# Patient Record
Sex: Female | Born: 1960 | Race: Black or African American | Hispanic: No | Marital: Single | Smoking: Never smoker
Health system: Southern US, Community
[De-identification: ages and names within clinical notes are randomized; demographics above are authoritative.]

## PROBLEM LIST (undated history)

## (undated) HISTORY — PX: BRAIN SURGERY: SHX531

## (undated) HISTORY — PX: ABDOMINAL HYSTERECTOMY: SHX81

## (undated) HISTORY — PX: KNEE SURGERY: SHX244

## (undated) HISTORY — PX: TONSILLECTOMY: SUR1361

---

## 2017-02-24 ENCOUNTER — Encounter (HOSPITAL_BASED_OUTPATIENT_CLINIC_OR_DEPARTMENT_OTHER): Payer: Self-pay

## 2017-02-24 ENCOUNTER — Emergency Department (HOSPITAL_BASED_OUTPATIENT_CLINIC_OR_DEPARTMENT_OTHER): Payer: BLUE CROSS/BLUE SHIELD

## 2017-02-24 ENCOUNTER — Emergency Department (HOSPITAL_BASED_OUTPATIENT_CLINIC_OR_DEPARTMENT_OTHER)
Admission: EM | Admit: 2017-02-24 | Discharge: 2017-02-25 | Disposition: A | Discharge status: Home / Self Care | Payer: BLUE CROSS/BLUE SHIELD | Attending: Emergency Medicine | Admitting: Emergency Medicine

## 2017-02-24 DIAGNOSIS — W01198A Fall on same level from slipping, tripping and stumbling with subsequent striking against other object, initial encounter: Secondary | ICD-10-CM | POA: Diagnosis not present

## 2017-02-24 DIAGNOSIS — S060X0A Concussion without loss of consciousness, initial encounter: Secondary | ICD-10-CM | POA: Insufficient documentation

## 2017-02-24 DIAGNOSIS — Y998 Other external cause status: Secondary | ICD-10-CM | POA: Insufficient documentation

## 2017-02-24 DIAGNOSIS — S0990XA Unspecified injury of head, initial encounter: Secondary | ICD-10-CM | POA: Diagnosis present

## 2017-02-24 DIAGNOSIS — Y9289 Other specified places as the place of occurrence of the external cause: Secondary | ICD-10-CM | POA: Diagnosis not present

## 2017-02-24 DIAGNOSIS — Y9342 Activity, yoga: Secondary | ICD-10-CM | POA: Insufficient documentation

## 2017-02-24 MED ORDER — ACETAMINOPHEN 325 MG PO TABS
650.0000 mg | ORAL_TABLET | Freq: Once | ORAL | Status: AC
  Administered 2017-02-24: 650 mg via ORAL
  Filled 2017-02-24: qty 2

## 2017-02-24 NOTE — Discharge Instructions (Signed)
Do not participate in any sports or any activities that could result in head trauma until you are cleared by your pediatrician,  primary care physician or neurologist.   Take acetaminophen (Tylenol) up to 975 mg (this is normally 3 over-the-counter pills) up to 3 times a day. Do not drink alcohol. Make sure your other medications do not contain acetaminophen (Read the labels!)  Please follow with your primary care doctor in the next 2 days for a check-up. They must obtain records for further management.   Do not hesitate to return to the Emergency Department for any new, worsening or concerning symptoms.

## 2017-02-24 NOTE — ED Triage Notes (Signed)
Pt states she fell approx 6-7 feet from yoga suspension strap yesterday-struck back of head on terra cotta floor-pain to back of head, neck and upper back-NAD-steady gait

## 2017-02-24 NOTE — ED Provider Notes (Signed)
[facility] EMERGENCY DEPARTMENT Provider Note   CSN: [id number] Arrival date & time: 02/24/17  1913     History   Chief Complaint Chief Complaint  Patient presents with  . Fall     HPI   Blood pressure 139/82, pulse 72, temperature 97.8 F (36.6 C), temperature source Oral, resp. rate 18, height 5\' 2"  (1.575 m), weight 56.8 kg (125 lb 3.5 oz), SpO2 100 %.  Desiree Morrison is a 56 y.o. female complaining of 7 out of 10 basilar headache status post head trauma yesterday.  Patient was in a yoga class and she fell backwards from a standing position approximately 6 feet at the base of her skull hit a tile floor.  There was no loss of consciousness, she is not anticoagulated, there was no nausea, vomiting, change in vision, dysarthria, ataxia.  No pain medication taken prior to arrival.  She reports that she has a blushing sensation to the neck and bilateral cheeks.  She does not describe it as warmth, she states is worse on the right than the left.  She denies any decrease in sensation.  History reviewed. No pertinent past medical history.  There are no active problems to display for this patient.   Past Surgical History:  Procedure Laterality Date  . ABDOMINAL HYSTERECTOMY    . BRAIN SURGERY    . KNEE SURGERY    . TONSILLECTOMY      OB History    No data available       Home Medications    Prior to Admission medications   Not on File    Family History No family history on file.  Social History Social History  Substance Use Topics  . Smoking status: Never Smoker  . Smokeless tobacco: Never Used  . Alcohol use Yes     Comment: occ     Allergies   Compazine [prochlorperazine edisylate]; Other; and Tetracyclines & related   Review of Systems Review of Systems  A complete review of systems was obtained and all systems are negative except as noted in the HPI and PMH.    Physical Exam Updated Vital Signs BP 139/82 (BP Location: Left Arm)    Pulse 72   Temp 97.8 F (36.6 C) (Oral)   Resp 18   Ht 5\' 2"  (1.575 m)   Wt 56.8 kg (125 lb 3.5 oz)   SpO2 100%   BMI 22.90 kg/m   Physical Exam  Constitutional: She is oriented to person, place, and time. She appears well-developed and well-nourished.  HENT:  Head: Normocephalic and atraumatic.  Mouth/Throat: Oropharynx is clear and moist.  Eyes: Pupils are equal, round, and reactive to light. Conjunctivae and EOM are normal.  Neck: Normal range of motion. Neck supple.  FROM to C-spine. Pt can touch chin to chest without discomfort. No TTP of midline cervical spine.   Cardiovascular: Normal rate, regular rhythm and intact distal pulses.   Pulmonary/Chest: Effort normal and breath sounds normal. No respiratory distress. She has no wheezes. She has no rales. She exhibits no tenderness.  Abdominal: Soft. Bowel sounds are normal. There is no tenderness.  Musculoskeletal: Normal range of motion. She exhibits no edema or tenderness.  Neurological: She is alert and oriented to person, place, and time. No cranial nerve deficit.  II-Visual fields grossly intact. III/IV/VI-Extraocular movements intact.  Pupils reactive bilaterally. V/VII-Smile symmetric, equal eyebrow raise,  facial sensation intact VIII- Hearing grossly intact IX/X-Normal gag XI-bilateral shoulder shrug XII-midline tongue extension Motor: 5/5  bilaterally with normal tone and bulk Cerebellar: Normal finger-to-nose  and normal heel-to-shin test.   Romberg negative Ambulates with a coordinated gait   Nursing note and vitals reviewed.    ED Treatments / Results  Labs (all labs ordered are listed, but only abnormal results are displayed) Labs Reviewed - No data to display  EKG  EKG Interpretation None       Radiology Ct Head Wo Contrast  Result Date: 02/24/2017 CLINICAL DATA:  Fall from yoga strap yesterday. Posterior cervical neck pain and occipital pain. Head trauma, minor, GCS>=13, low clinical risk,  initial exam; C-spine trauma, low clinical risk (NEXUS/CCR) EXAM: CT HEAD WITHOUT CONTRAST CT CERVICAL SPINE WITHOUT CONTRAST TECHNIQUE: Multidetector CT imaging of the head and cervical spine was performed following the standard protocol without intravenous contrast. Multiplanar CT image reconstructions of the cervical spine were also generated. COMPARISON:  None. FINDINGS: CT HEAD FINDINGS Brain: No intracranial hemorrhage, mass effect, or midline shift. No hydrocephalus. The basilar cisterns are patent. No evidence of territorial infarct or acute ischemia. No extra-axial or intracranial fluid collection. Vascular: No hyperdense vessel or unexpected calcification. Skull: No fracture or focal lesion. Sinuses/Orbits: Paranasal sinuses and mastoid air cells are clear. The visualized orbits are unremarkable. Other: None. CT CERVICAL SPINE FINDINGS Alignment: Normal. Skull base and vertebrae: No acute fracture. Vertebral body heights are maintained. The dens and skull base are intact. Instance note of small bilateral cervical ribs. Soft tissues and spinal canal: No prevertebral fluid or swelling. No visible canal hematoma. Disc levels: Mild endplate spurring at C5-C6 with preservation of disc spaces. Upper chest: No acute abnormality. Other: None. IMPRESSION: 1.  No acute intracranial abnormality.  No skull fracture. 2. No fracture or subluxation of the cervical spine. Electronically Signed   By: Rubye Oaks M.D.   On: 02/24/2017 22:26   Ct Cervical Spine Wo Contrast  Result Date: 02/24/2017 CLINICAL DATA:  Fall from yoga strap yesterday. Posterior cervical neck pain and occipital pain. Head trauma, minor, GCS>=13, low clinical risk, initial exam; C-spine trauma, low clinical risk (NEXUS/CCR) EXAM: CT HEAD WITHOUT CONTRAST CT CERVICAL SPINE WITHOUT CONTRAST TECHNIQUE: Multidetector CT imaging of the head and cervical spine was performed following the standard protocol without intravenous contrast. Multiplanar  CT image reconstructions of the cervical spine were also generated. COMPARISON:  None. FINDINGS: CT HEAD FINDINGS Brain: No intracranial hemorrhage, mass effect, or midline shift. No hydrocephalus. The basilar cisterns are patent. No evidence of territorial infarct or acute ischemia. No extra-axial or intracranial fluid collection. Vascular: No hyperdense vessel or unexpected calcification. Skull: No fracture or focal lesion. Sinuses/Orbits: Paranasal sinuses and mastoid air cells are clear. The visualized orbits are unremarkable. Other: None. CT CERVICAL SPINE FINDINGS Alignment: Normal. Skull base and vertebrae: No acute fracture. Vertebral body heights are maintained. The dens and skull base are intact. Instance note of small bilateral cervical ribs. Soft tissues and spinal canal: No prevertebral fluid or swelling. No visible canal hematoma. Disc levels: Mild endplate spurring at C5-C6 with preservation of disc spaces. Upper chest: No acute abnormality. Other: None. IMPRESSION: 1.  No acute intracranial abnormality.  No skull fracture. 2. No fracture or subluxation of the cervical spine. Electronically Signed   By: Rubye Oaks M.D.   On: 02/24/2017 22:26    Procedures Procedures (including critical care time)  Medications Ordered in ED Medications  acetaminophen (TYLENOL) tablet 650 mg (650 mg Oral Given 02/24/17 2121)     Initial Impression / Assessment and Plan / ED  Course  I have reviewed the triage vital signs and the nursing notes.  Pertinent labs & imaging results that were available during my care of the patient were reviewed by me and considered in my medical decision making (see chart for details).     Vitals:   02/24/17 1920  BP: 139/82  Pulse: 72  Resp: 18  Temp: 97.8 F (36.6 C)  TempSrc: Oral  SpO2: 100%  Weight: 56.8 kg (125 lb 3.5 oz)  Height: 5\' 2"  (1.575 m)    Medications  acetaminophen (TYLENOL) tablet 650 mg (650 mg Oral Given 02/24/17 2121)    SUSSAN METER is 56 y.o. female presenting with Headache after falling approximately 6 feet yesterday onto a tile floor.  There was no loss of consciousness, this patient is not anticoagulated.  Nonfocal neurologic exam except for a paresthesia to the bilateral cheeks and neck.  Head and C-spine CT negative, patient given concussion precautions and advised to follow closely with primary care.  Evaluation does not show pathology that would require ongoing emergent intervention or inpatient treatment. Pt is hemodynamically stable and mentating appropriately. Discussed findings and plan with patient/guardian, who agrees with care plan. All questions answered. Return precautions discussed and outpatient follow up given.      Final Clinical Impressions(s) / ED Diagnoses   Final diagnoses:  Concussion without loss of consciousness, initial encounter    New Prescriptions New Prescriptions   No medications on file     Kaylyn Lim 02/25/17 0109    Vanetta Mulders, MD 03/01/17 [phone]

## 2020-02-27 ENCOUNTER — Emergency Department (HOSPITAL_BASED_OUTPATIENT_CLINIC_OR_DEPARTMENT_OTHER)
Admission: EM | Admit: 2020-02-27 | Discharge: 2020-02-27 | Disposition: A | Discharge status: Home / Self Care | Payer: No Typology Code available for payment source | Attending: Emergency Medicine | Admitting: Emergency Medicine

## 2020-02-27 ENCOUNTER — Encounter (HOSPITAL_BASED_OUTPATIENT_CLINIC_OR_DEPARTMENT_OTHER): Payer: Self-pay | Admitting: Emergency Medicine

## 2020-02-27 ENCOUNTER — Other Ambulatory Visit: Payer: Self-pay

## 2020-02-27 ENCOUNTER — Emergency Department (HOSPITAL_BASED_OUTPATIENT_CLINIC_OR_DEPARTMENT_OTHER): Payer: No Typology Code available for payment source

## 2020-02-27 DIAGNOSIS — R519 Headache, unspecified: Secondary | ICD-10-CM

## 2020-02-27 DIAGNOSIS — R5383 Other fatigue: Secondary | ICD-10-CM | POA: Insufficient documentation

## 2020-02-27 LAB — CBC
HCT: 41.6 % (ref 36.0–46.0)
Hemoglobin: 13.1 g/dL (ref 12.0–15.0)
MCH: 27.7 pg (ref 26.0–34.0)
MCHC: 31.5 g/dL (ref 30.0–36.0)
MCV: 87.9 fL (ref 80.0–100.0)
Platelets: 273 10*3/uL (ref 150–400)
RBC: 4.73 MIL/uL (ref 3.87–5.11)
RDW: 13.6 % (ref 11.5–15.5)
WBC: 10.1 10*3/uL (ref 4.0–10.5)
nRBC: 0 % (ref 0.0–0.2)

## 2020-02-27 LAB — BASIC METABOLIC PANEL
Anion gap: 10 (ref 5–15)
BUN: 14 mg/dL (ref 6–20)
CO2: 24 mmol/L (ref 22–32)
Calcium: 9.6 mg/dL (ref 8.9–10.3)
Chloride: 105 mmol/L (ref 98–111)
Creatinine, Ser: 0.75 mg/dL (ref 0.44–1.00)
GFR, Estimated: >60 mL/min (ref >60)
Glucose, Bld: 105 mg/dL — ABNORMAL HIGH (ref 70–99)
Potassium: 3.9 mmol/L (ref 3.5–5.1)
Sodium: 139 mmol/L (ref 135–145)

## 2020-02-27 MED ORDER — SODIUM CHLORIDE 0.9 % IV BOLUS
1000.0000 mL | Freq: Once | INTRAVENOUS | Status: AC
  Administered 2020-02-27: 1000 mL via INTRAVENOUS

## 2020-02-27 MED ORDER — DEXAMETHASONE SODIUM PHOSPHATE 10 MG/ML IJ SOLN
10.0000 mg | Freq: Once | INTRAMUSCULAR | Status: AC
  Administered 2020-02-27: 10 mg via INTRAVENOUS
  Filled 2020-02-27: qty 1

## 2020-02-27 MED ORDER — KETOROLAC TROMETHAMINE 15 MG/ML IJ SOLN
15.0000 mg | Freq: Once | INTRAMUSCULAR | Status: AC
  Administered 2020-02-27: 15 mg via INTRAVENOUS
  Filled 2020-02-27: qty 1

## 2020-02-27 NOTE — ED Notes (Signed)
In to evaluate pt and begin IVF as per ED MD orders, pt on phone texting, standing at bedside, appears in no distress. Will return when pt is ready

## 2020-02-27 NOTE — ED Notes (Signed)
AVS reviewed with client. Copy of AVS provided, Opportunity for questions provided

## 2020-02-27 NOTE — ED Triage Notes (Addendum)
Headache since Friday with high BP readings at home. States that on Friday she had a 20 sec period of total vision loss. Vision has been normal since then. Pt reports she does not feel like herself.

## 2020-02-27 NOTE — ED Provider Notes (Signed)
[facility] EMERGENCY DEPARTMENT Provider Note   CSN: [id number] Arrival date & time: 02/27/20  1425     History Chief Complaint  Patient presents with  . Headache  . Fatigue    Desiree Morrison is a 59 y.o. female.  The history is provided by the patient.  Headache Pain location:  Generalized Quality:  Sharp Radiates to:  Does not radiate Timing:  Constant Progression:  Improving Chronicity:  New Relieved by:  Acetaminophen Worsened by:  Nothing Associated symptoms: no abdominal pain, no back pain, no cough, no dizziness, no ear pain, no eye pain, no fever, no numbness, no seizures, no sore throat, no vomiting and no weakness        History reviewed. No pertinent past medical history.  There are no problems to display for this patient.   Past Surgical History:  Procedure Laterality Date  . ABDOMINAL HYSTERECTOMY    . BRAIN SURGERY    . KNEE SURGERY    . TONSILLECTOMY       OB History   No obstetric history on file.     No family history on file.  Social History   Tobacco Use  . Smoking status: Never Smoker  . Smokeless tobacco: Never Used  Substance Use Topics  . Alcohol use: Yes    Comment: occ  . Drug use: No    Home Medications Prior to Admission medications   Not on File    Allergies    Compazine [prochlorperazine edisylate], Other, and Tetracyclines & related  Review of Systems   Review of Systems  Constitutional: Negative for chills and fever.  HENT: Negative for ear pain and sore throat.   Eyes: Positive for visual disturbance. Negative for pain.  Respiratory: Negative for cough and shortness of breath.   Cardiovascular: Negative for chest pain and palpitations.  Gastrointestinal: Negative for abdominal pain and vomiting.  Genitourinary: Negative for dysuria and hematuria.  Musculoskeletal: Negative for arthralgias and back pain.  Skin: Negative for color change and rash.  Neurological: Positive for headaches. Negative  for dizziness, tremors, seizures, syncope, facial asymmetry, speech difficulty, weakness, light-headedness and numbness.  All other systems reviewed and are negative.   Physical Exam Updated Vital Signs  ED Triage Vitals  Enc Vitals Group     BP 02/27/20 1431 (!) 142/81     Pulse Rate 02/27/20 1431 71     Resp 02/27/20 1431 18     Temp 02/27/20 1431 98.7 F (37.1 C)     Temp Source 02/27/20 1431 Oral     SpO2 02/27/20 1431 100 %     Weight 02/27/20 1429 127 lb (57.6 kg)     Height 02/27/20 1429 5\' 2"  (1.575 m)     Head Circumference --      Peak Flow --      Pain Score 02/27/20 1430 4     Pain Loc --      Pain Edu? --      Excl. in GC? --      Physical Exam Vitals and nursing note reviewed.  Constitutional:      General: She is not in acute distress.    Appearance: She is well-developed. She is not ill-appearing.  HENT:     Head: Normocephalic and atraumatic.     Mouth/Throat:     Mouth: Mucous membranes are moist.  Eyes:     General: No visual field deficit.    Extraocular Movements: Extraocular movements intact.  Conjunctiva/sclera: Conjunctivae normal.     Pupils: Pupils are equal, round, and reactive to light.  Cardiovascular:     Rate and Rhythm: Normal rate and regular rhythm.     Heart sounds: Normal heart sounds. No murmur heard.   Pulmonary:     Effort: Pulmonary effort is normal. No respiratory distress.     Breath sounds: Normal breath sounds.  Abdominal:     Palpations: Abdomen is soft.     Tenderness: There is no abdominal tenderness.  Musculoskeletal:        General: Normal range of motion.     Cervical back: Normal range of motion and neck supple.  Skin:    General: Skin is warm and dry.  Neurological:     Mental Status: She is alert and oriented to person, place, and time.     Cranial Nerves: No cranial nerve deficit, dysarthria or facial asymmetry.     Sensory: No sensory deficit.     Motor: No weakness.     Coordination: Coordination  normal.     Comments: No visual field deficit, 5+ out of 5 strength throughout, normal sensation, no drift, normal finger-nose-finger, normal speech  Psychiatric:        Mood and Affect: Mood normal.     ED Results / Procedures / Treatments   Labs (all labs ordered are listed, but only abnormal results are displayed) Labs Reviewed  BASIC METABOLIC PANEL - Abnormal; Notable for the following components:      Result Value   Glucose, Bld 105 (*)    All other components within normal limits  CBC    EKG EKG Interpretation  Date/Time:  Sunday February 27 2020 14:30:06 EDT Ventricular Rate:  64 PR Interval:  134 QRS Duration: 72 QT Interval:  358 QTC Calculation: 369 R Axis:   44 Text Interpretation: Normal sinus rhythm Normal ECG Confirmed by Virgina Norfolk [phone]) on 02/27/2020 4:16:21 PM   Radiology CT Head Wo Contrast  Result Date: 02/27/2020 CLINICAL DATA:  Headache EXAM: CT HEAD WITHOUT CONTRAST TECHNIQUE: Contiguous axial images were obtained from the base of the skull through the vertex without intravenous contrast. COMPARISON:  02/24/2017 FINDINGS: Brain: No acute intracranial abnormality. Specifically, no hemorrhage, hydrocephalus, mass lesion, acute infarction, or significant intracranial injury. Vascular: No hyperdense vessel or unexpected calcification. Skull: No acute calvarial abnormality. Sinuses/Orbits: Visualized paranasal sinuses and mastoids clear. Orbital soft tissues unremarkable. Other: None IMPRESSION: Normal study. Electronically Signed   By: Charlett Nose M.D.   On: 02/27/2020 16:38    Procedures Procedures (including critical care time)  Medications Ordered in ED Medications  sodium chloride 0.9 % bolus 1,000 mL (1,000 mLs Intravenous New Bag/Given 02/27/20 1651)  ketorolac (TORADOL) 15 MG/ML injection 15 mg (15 mg Intravenous Given 02/27/20 1734)  dexamethasone (DECADRON) injection 10 mg (10 mg Intravenous Given 02/27/20 1734)    ED Course  I have  reviewed the triage vital signs and the nursing notes.  Pertinent labs & imaging results that were available during my care of the patient were reviewed by me and considered in my medical decision making (see chart for details).    MDM Rules/Calculators/A&P                          Desiree Morrison is a 59 year old female with no significant medical history presents the ED with headache.  Normal vitals.  No fever.  Mild headache for the last several days.  Headache started  after long car drive.  She had some visual auras and then a headache but has gradually gotten better over the last several days.  Energy levels have been low.  Neurologically she is intact.  No concern for stroke.  No significant anemia, electrolyte abnormality.  EKG shows sinus rhythm.  Doubt cardiac process.  Overall suspect tension type headache with visual auras.  We will have her follow-up with primary care doctor and discharged from the ED in good condition.  This chart was dictated using voice recognition software.  Despite best efforts to proofread,  errors can occur which can change the documentation meaning.    Final Clinical Impression(s) / ED Diagnoses Final diagnoses:  Other fatigue  Bad headache    Rx / DC Orders ED Discharge Orders    None       Virgina Norfolk, DO 02/27/20 1738

## 2020-02-27 NOTE — ED Notes (Signed)
IVF bolus initiated, instructed pt to turn off mobile phone and computer, lights were dimmed to promote rest and in decreasing  HA syptoms

## 2020-03-08 ENCOUNTER — Other Ambulatory Visit: Payer: Self-pay

## 2020-03-08 ENCOUNTER — Emergency Department (HOSPITAL_BASED_OUTPATIENT_CLINIC_OR_DEPARTMENT_OTHER)
Admission: EM | Admit: 2020-03-08 | Discharge: 2020-03-08 | Disposition: A | Discharge status: Home / Self Care | Payer: No Typology Code available for payment source | Attending: Emergency Medicine | Admitting: Emergency Medicine

## 2020-03-08 ENCOUNTER — Encounter (HOSPITAL_BASED_OUTPATIENT_CLINIC_OR_DEPARTMENT_OTHER): Payer: Self-pay | Admitting: *Deleted

## 2020-03-08 DIAGNOSIS — R519 Headache, unspecified: Secondary | ICD-10-CM

## 2020-03-08 LAB — SEDIMENTATION RATE: Sed Rate: 17 mm/h (ref 0–22)

## 2020-03-08 MED ORDER — LACTATED RINGERS IV BOLUS
1000.0000 mL | Freq: Once | INTRAVENOUS | Status: AC
  Administered 2020-03-08: 1000 mL via INTRAVENOUS

## 2020-03-08 MED ORDER — DIPHENHYDRAMINE HCL 50 MG/ML IJ SOLN
25.0000 mg | Freq: Once | INTRAMUSCULAR | Status: AC
  Administered 2020-03-08: 25 mg via INTRAVENOUS
  Filled 2020-03-08: qty 1

## 2020-03-08 MED ORDER — METOCLOPRAMIDE HCL 5 MG/ML IJ SOLN
10.0000 mg | INTRAMUSCULAR | Status: AC
  Administered 2020-03-08: 10 mg via INTRAVENOUS
  Filled 2020-03-08: qty 2

## 2020-03-08 MED ORDER — SODIUM CHLORIDE 0.9 % IV SOLN
INTRAVENOUS | Status: DC | PRN
  Administered 2020-03-08: 500 mL via INTRAVENOUS

## 2020-03-08 MED ORDER — MAGNESIUM SULFATE 2 GM/50ML IV SOLN
2.0000 g | Freq: Once | INTRAVENOUS | Status: AC
  Administered 2020-03-08: 2 g via INTRAVENOUS
  Filled 2020-03-08: qty 50

## 2020-03-08 MED ORDER — KETOROLAC TROMETHAMINE 15 MG/ML IJ SOLN
15.0000 mg | Freq: Once | INTRAMUSCULAR | Status: AC
  Administered 2020-03-08: 15 mg via INTRAVENOUS
  Filled 2020-03-08: qty 1

## 2020-03-08 NOTE — ED Triage Notes (Addendum)
Was here recently with the same complaint of headache. Has an appointment for an MRI on the 15th by her PCP.  The headache did not really resolved since she left here.  It is getting worst.

## 2020-03-08 NOTE — ED Provider Notes (Signed)
[place] EMERGENCY DEPARTMENT Provider Note   CSN: [id number] Arrival date & time: 03/08/20  1015     History Chief Complaint  Patient presents with  . Headache    Desiree Morrison is a 59 y.o. female.  59 year old female who presents with headache.  Patient presented here on 10/31 for headache.  She states that about a week prior to presentation, she had gradual onset of mild headache that eventually became persistent and more severe.  She has had 1 single brief episode of bilateral vision loss that had lasted approximately 20 seconds then resolved, never reoccurred.  When she was evaluated in the ED, she had normal head CT and reassuring lab work.  She was given medications and discharged home.  She states that her headache was only slightly improved at time of discharge.  Since then, she has continued to have headaches at home that she describes as frontal behind her left eye.  She has been taking Tylenol at home without much relief.  She recently tried Maxalt from a friend but no improvement.  She has had associated photophobia and feels like she is not mentally processing things as quickly as she normally does but she denies any confusion, speech problems, extremity weakness, or problems walking.  No fevers or vision changes since that brief initial episode.  She has followed up with the [place] [facility] and they referred her to neurology and have scheduled an MRI for 10/15 but headache was severe today which prompted her to come in. She takes estrogen patch for night sweats, no h/o blood clots. No hx migraines.  The history is provided by the patient.  Headache      History reviewed. No pertinent past medical history.  There are no problems to display for this patient.   Past Surgical History:  Procedure Laterality Date  . ABDOMINAL HYSTERECTOMY    . BRAIN SURGERY    . KNEE SURGERY    . TONSILLECTOMY       OB History   No obstetric history on file.     History  reviewed. No pertinent family history.  Social History   Tobacco Use  . Smoking status: Never Smoker  . Smokeless tobacco: Never Used  Substance Use Topics  . Alcohol use: Yes    Comment: occ  . Drug use: No    Home Medications Prior to Admission medications   Not on File    Allergies    Compazine [prochlorperazine edisylate], Other, and Tetracyclines & related  Review of Systems   Review of Systems  Neurological: Positive for headaches.   All other systems reviewed and are negative except that which was mentioned in HPI  Physical Exam Updated Vital Signs BP (!) 114/95 (BP Location: Right Arm)   Pulse 71   Temp 98.1 F (36.7 C) (Oral)   Resp 19   Ht 5' 2"  (1.575 m)   Wt 57.2 kg   SpO2 100%   BMI 23.05 kg/m   Physical Exam Vitals and nursing note reviewed.  Constitutional:      General: She is not in acute distress.    Appearance: She is well-developed.     Comments: Awake but towel over head, in dark room  HENT:     Head: Normocephalic and atraumatic.  Eyes:     Extraocular Movements: Extraocular movements intact.     Conjunctiva/sclera: Conjunctivae normal.     Pupils: Pupils are equal, round, and reactive to light.  Cardiovascular:  Rate and Rhythm: Normal rate and regular rhythm.     Heart sounds: Normal heart sounds. No murmur heard.   Pulmonary:     Effort: Pulmonary effort is normal. No respiratory distress.     Breath sounds: Normal breath sounds.  Abdominal:     General: Bowel sounds are normal. There is no distension.     Palpations: Abdomen is soft.     Tenderness: There is no abdominal tenderness.  Musculoskeletal:     Cervical back: Neck supple.  Skin:    General: Skin is warm and dry.  Neurological:     Mental Status: She is alert and oriented to person, place, and time.     Cranial Nerves: No cranial nerve deficit.     Motor: No abnormal muscle tone.     Deep Tendon Reflexes: Reflexes are normal and symmetric.     Comments:  Fluent speech, normal finger-to-nose testing, negative pronator drift, no clonus 5/5 strength and normal sensation x all 4 extremities  Psychiatric:        Speech: Speech normal.        Thought Content: Thought content normal.        Judgment: Judgment normal.     ED Results / Procedures / Treatments   Labs (all labs ordered are listed, but only abnormal results are displayed) Labs Reviewed  SEDIMENTATION RATE    EKG None  Radiology No results found.  Procedures Procedures (including critical care time)  Medications Ordered in ED Medications  0.9 %  sodium chloride infusion (0 mLs Intravenous Stopped 03/08/20 1322)  lactated ringers bolus 1,000 mL (0 mLs Intravenous Stopped 03/08/20 1321)  magnesium sulfate IVPB 2 g 50 mL (0 g Intravenous Stopped 03/08/20 1322)  diphenhydrAMINE (BENADRYL) injection 25 mg (25 mg Intravenous Given 03/08/20 1158)  metoCLOPramide (REGLAN) injection 10 mg (10 mg Intravenous Given 03/08/20 1158)  ketorolac (TORADOL) 15 MG/ML injection 15 mg (15 mg Intravenous Given 03/08/20 1158)    ED Course  I have reviewed the triage vital signs and the nursing notes.  Pertinent labs that were available during my care of the patient were reviewed by me and considered in my medical decision making (see chart for details).    MDM Rules/Calculators/A&P                          Neurologically iintact on exam, normal VS. Recent normal head CT makes SDH or mass unlikely. Gave migraine cocktail and obtained ESR to evaluate for GCA. ESR normal. After above medications, pt stated she felt much better w/ significant improvement in headache. She wants to go home. [facility] in 5  Days. I considered the possibility of venous sinus thrombosis but with significant improvement in pain, I feel it is reasonable to hold off on ED transfer for MRI today. I have reviewed return precautions and discussed supportive measures. Final  Clinical Impression(s) / ED Diagnoses Final diagnoses:  Bad headache    Rx / DC Orders ED Discharge Orders    None       Audrianna Driskill, Wenda Overland, MD 03/08/20 1400

## 2020-03-08 NOTE — ED Notes (Signed)
Review D/C papers with pt, pt states understanding, pt denies questions at this time. 

## 2022-02-27 IMAGING — CT CT HEAD W/O CM
3 series · 16 of 47 positions shown, 19 images · non-contrast
Comparison: 02/24/2017

CLINICAL DATA: Headache

EXAM:
CT HEAD WITHOUT CONTRAST
TECHNIQUE: Contiguous axial images were obtained from the base of the skull
through the vertex without intravenous contrast.

[Series 2: head wo · axial · 0.44mm/px · z∈[-172,-47]mm · 10 of 31 slices shown, 13 images]
[im 3/31  brain]
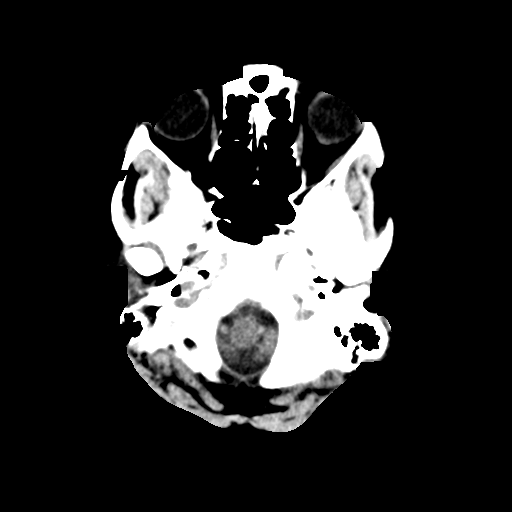
[im 3/31  bone]
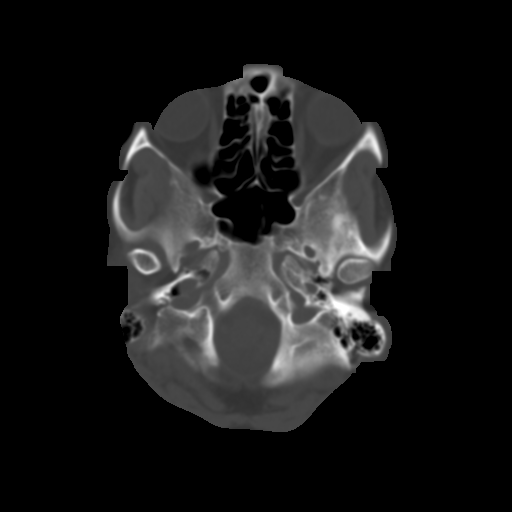
[im 6/31  brain]
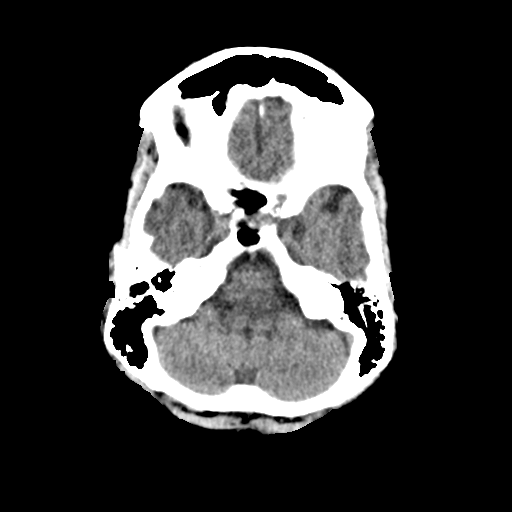
[im 9/31  brain]
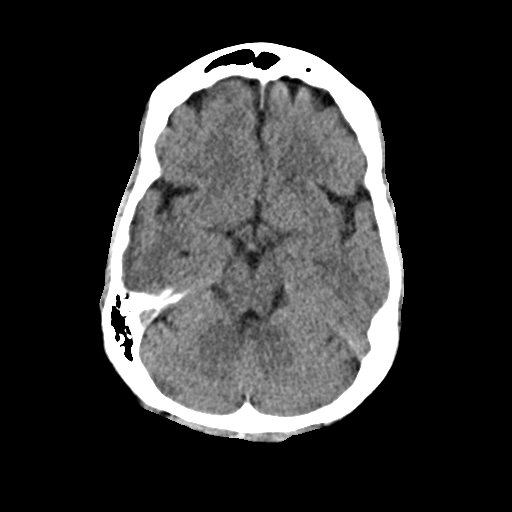
[im 11/31  brain]
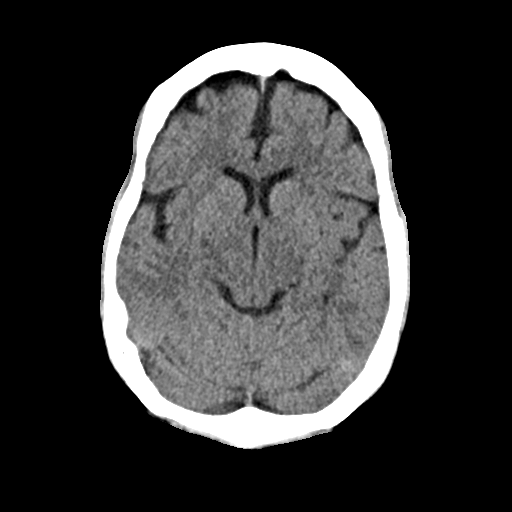
[im 14/31  brain]
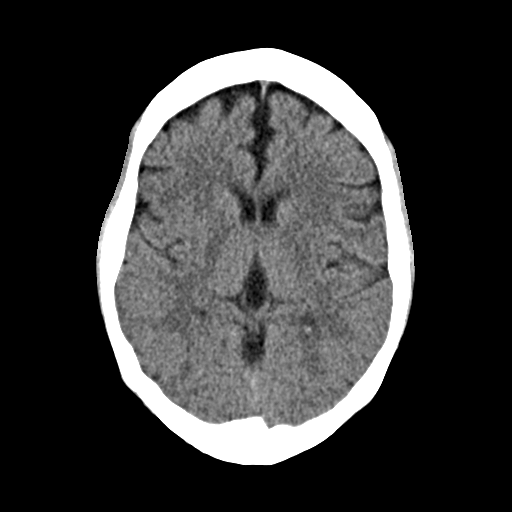
[im 14/31  bone]
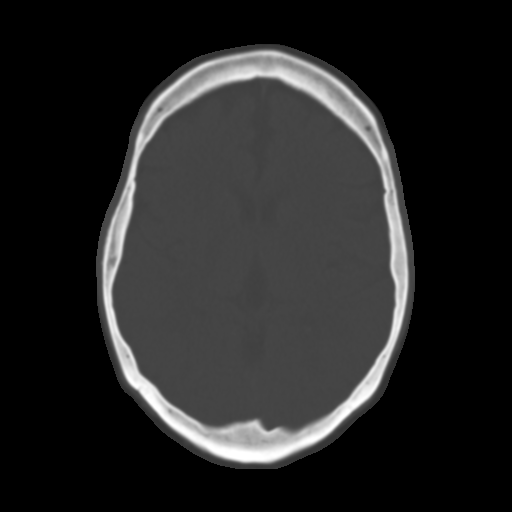
[im 17/31  brain]
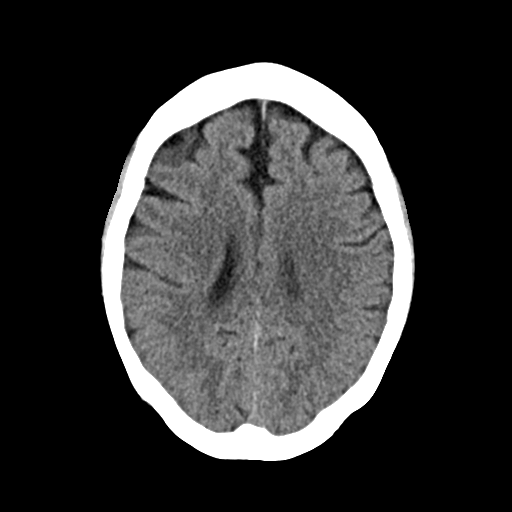
[im 20/31  brain]
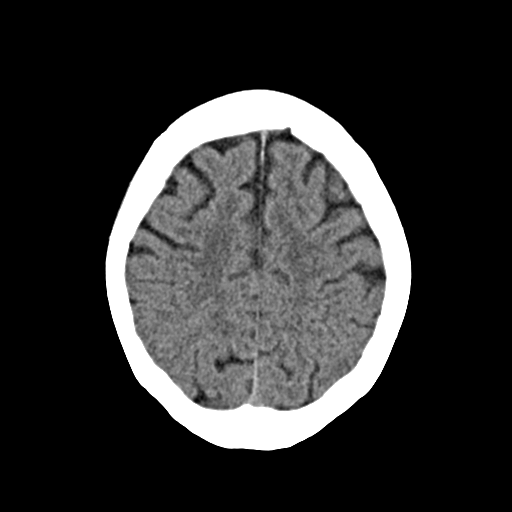
[im 23/31  brain]
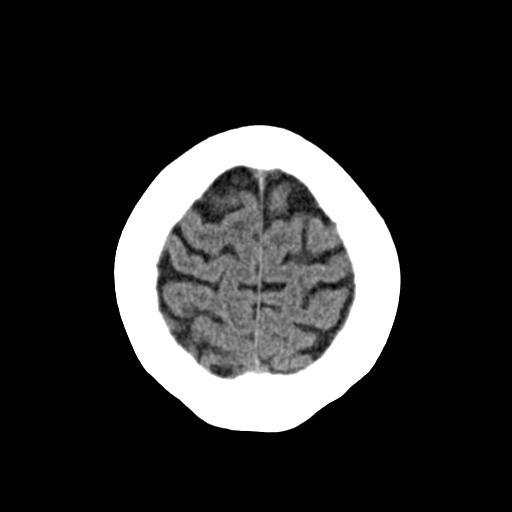
[im 25/31  brain]
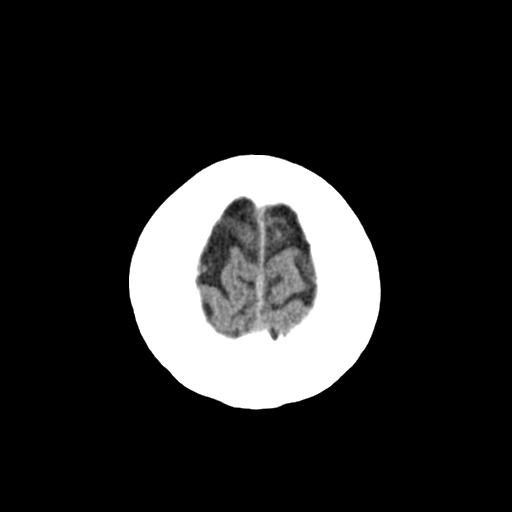
[im 25/31  bone]
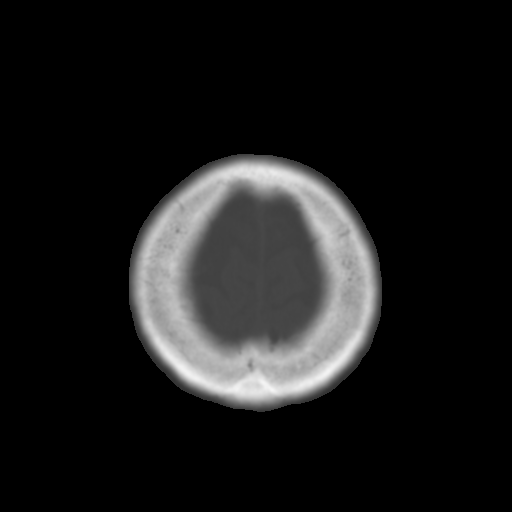
[im 28/31  brain]
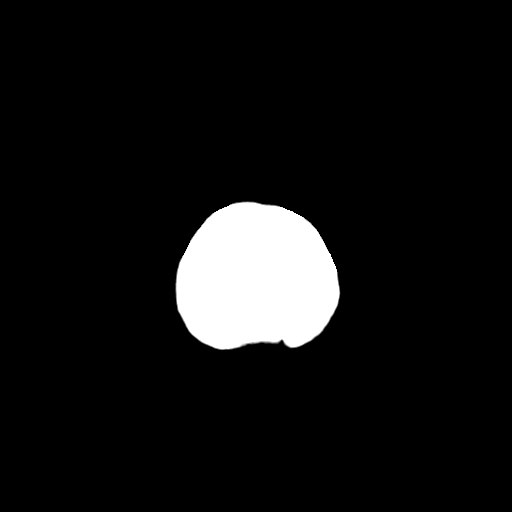

[Series 4: coronal soft · coronal · 0.31mm/px · 3 of 67 slices shown]
[im 23/67  brain]
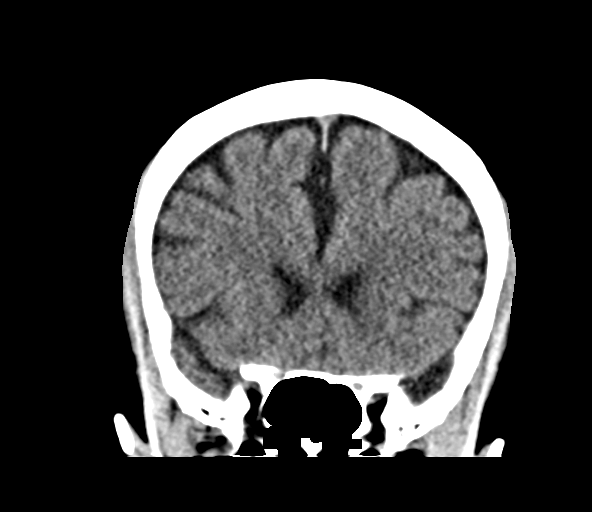
[im 30/67  brain]
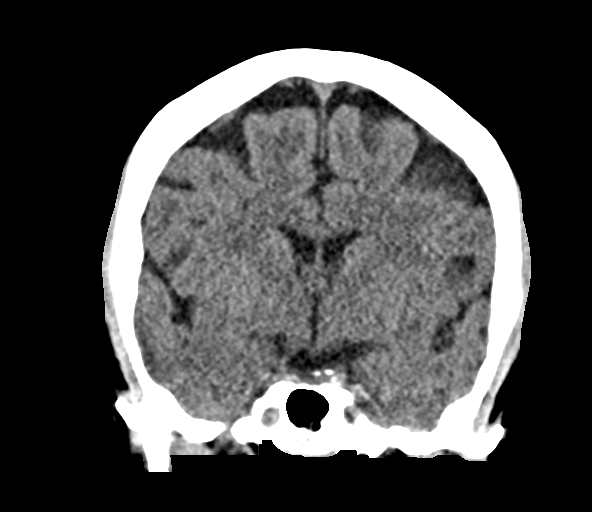
[im 37/67  brain]
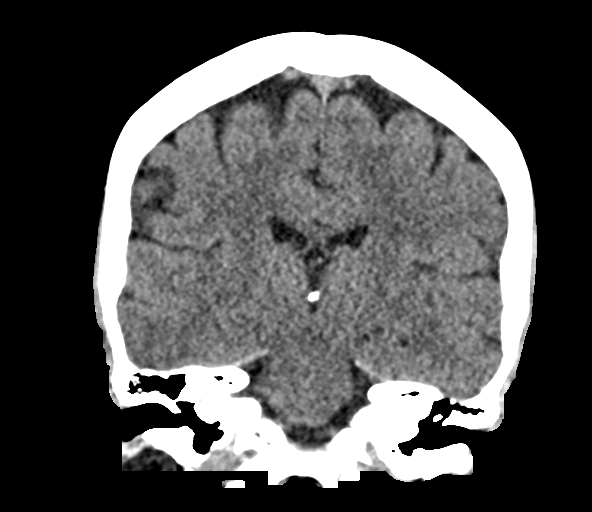

[Series 5: sag soft · sagittal · 0.31mm/px · 3 of 53 slices shown]
[im 18/53  brain]
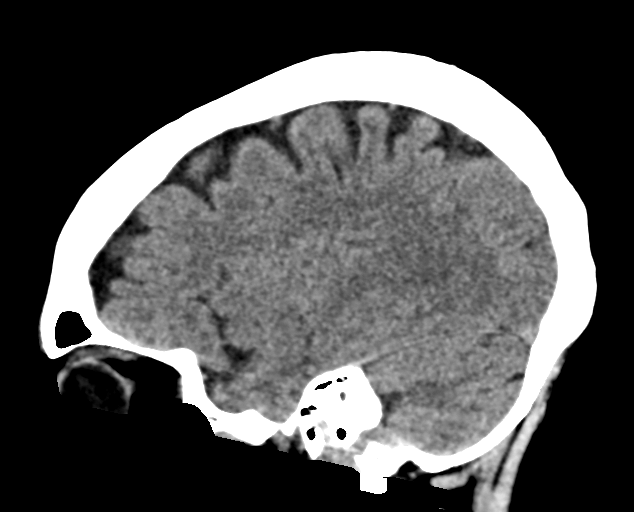
[im 27/53  brain]
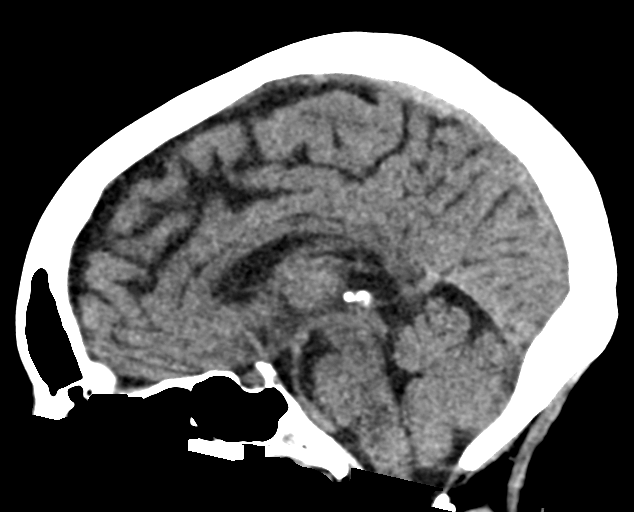
[im 35/53  brain]
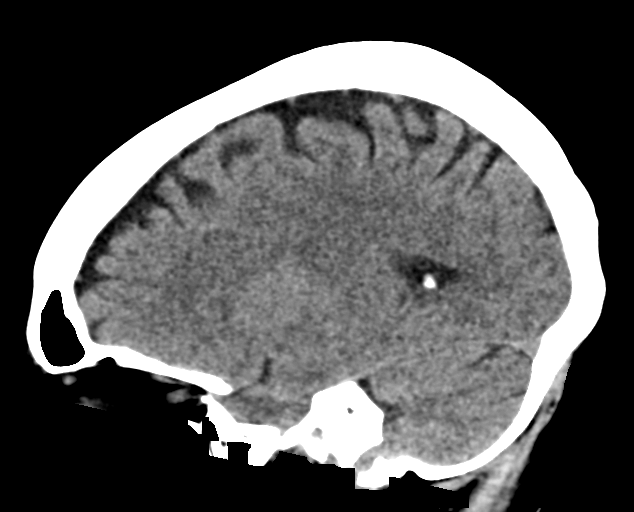

[16 of 47 positions shown; findings below may reference images not displayed]

FINDINGS: Brain: No acute intracranial abnormality. Specifically, no
hemorrhage, hydrocephalus, mass lesion, acute infarction, or
significant intracranial injury.

Vascular: No hyperdense vessel or unexpected calcification.

Skull: No acute calvarial abnormality.

Sinuses/Orbits: Visualized paranasal sinuses and mastoids clear.
Orbital soft tissues unremarkable.

Other: None
IMPRESSION: Normal study.
